# Patient Record
Sex: Male | Born: 1995 | Race: White | Hispanic: No | Marital: Single
Health system: Southern US, Community
[De-identification: ages and names within clinical notes are randomized; demographics above are authoritative.]

## PROBLEM LIST (undated history)

## (undated) DIAGNOSIS — K4090 Unilateral inguinal hernia, without obstruction or gangrene, not specified as recurrent: Secondary | ICD-10-CM

---
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
## 2017-03-24 IMAGING — CT CT NECK W/O CM
2 of 3 series · 8 of 14 positions shown, 9 images · non-contrast
Comparison: None.

CLINICAL DATA: Difficulty swallowing, possible swallowed foreign
body eating chicken

EXAM:
CT NECK WITHOUT CONTRAST
TECHNIQUE: Multidetector CT imaging of the neck was performed following the
standard protocol without intravenous contrast.

[Series 2: axial neck · axial · 0.52mm/px · z∈[-253,-83]mm · 4 of 143 slices shown]
[im 29/143  bone]
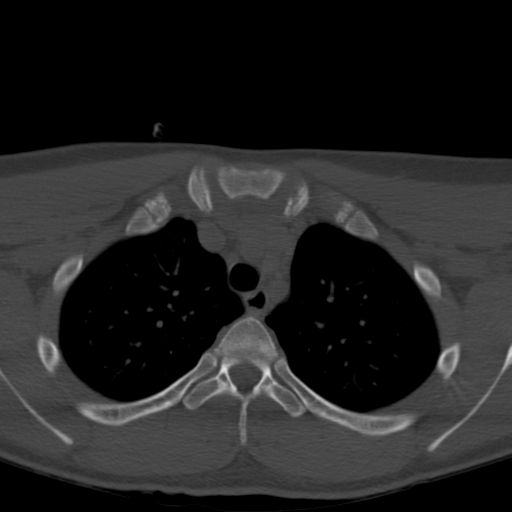
[im 57/143  bone]
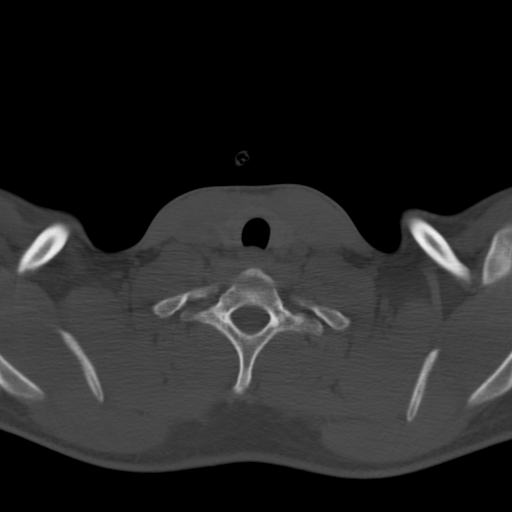
[im 86/143  bone]
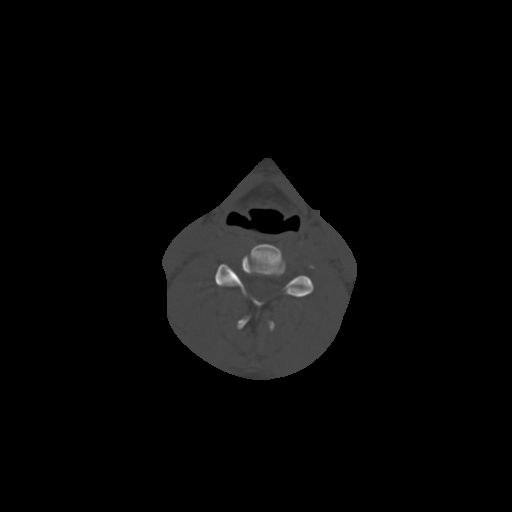
[im 114/143  bone]
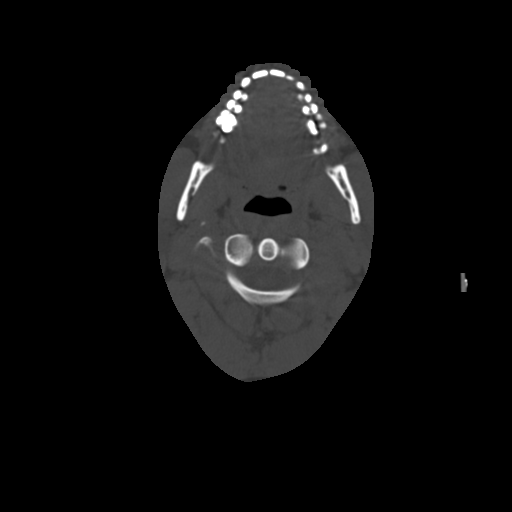

[Series 7: ax oropharynx · axial · 0.46mm/px · z∈[-276,-105]mm · 4 of 149 slices shown, 5 images]
[im 30/149  soft-tissue]
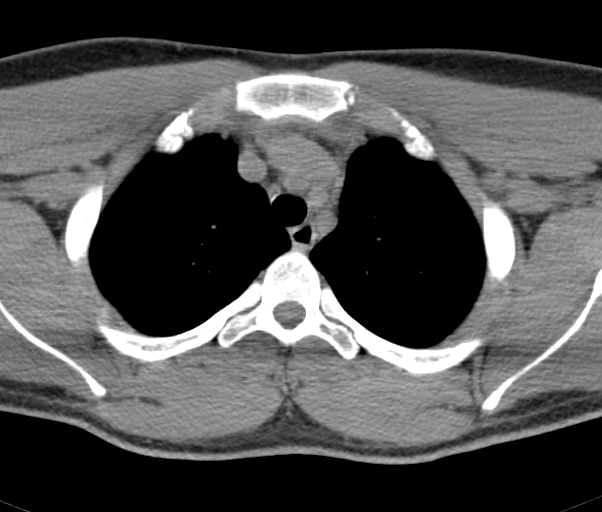
[im 30/149  bone]
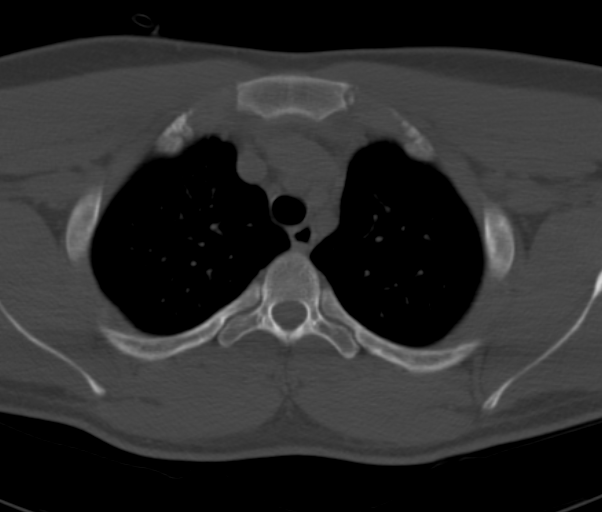
[im 60/149  bone]
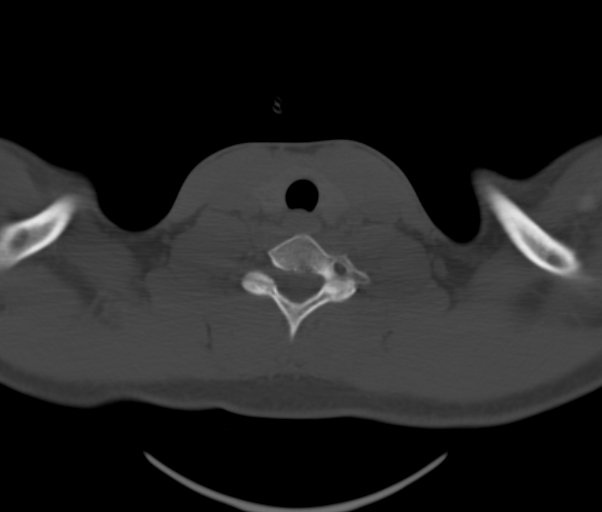
[im 89/149  bone]
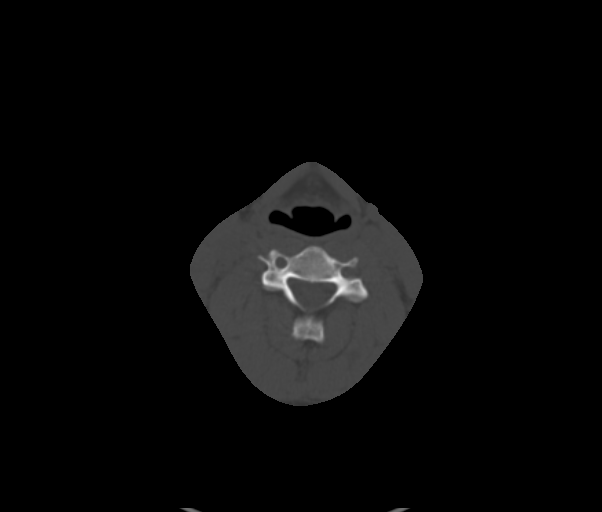
[im 119/149  bone]
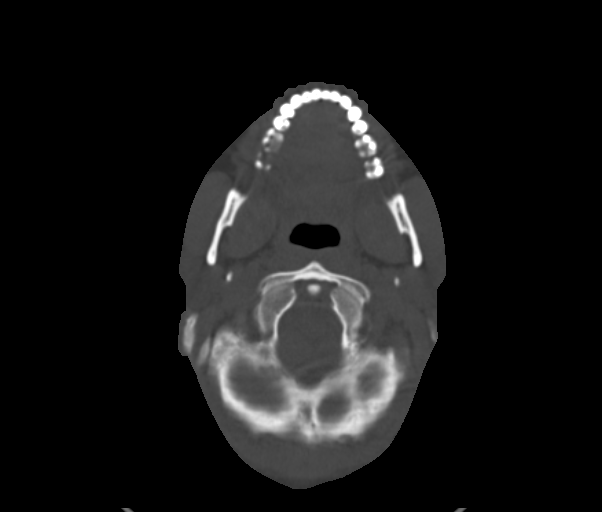

[8 of 14 positions shown; findings below may reference images not displayed]

FINDINGS: Airway is patent and midline. Pharynx and larynx are unremarkable.
No radiopaque foreign body. No soft tissue abnormality. Lung apices
are unremarkable. Thyroid gland appears normal. No lymphadenopathy.
No acute osseous abnormality.
IMPRESSION: No radiopaque foreign body or acute abnormality identified.

## 2021-05-28 ENCOUNTER — Ambulatory Visit: Admit: 2021-05-28 | Discharge: 2021-05-28 | Payer: TRICARE (CHAMPUS) | Attending: Surgery

## 2021-05-28 DIAGNOSIS — K4090 Unilateral inguinal hernia, without obstruction or gangrene, not specified as recurrent: Secondary | ICD-10-CM

## 2021-05-28 NOTE — Progress Notes (Signed)
HPI: Jacob Cobb is a 26 y.o. male who presents with a right sided inguinal hernia. He states that he started feeling some pain and discomfort in the area about a month and a half ago that increased with walking and lifting heavy weights. He did not notice any bulges or redness in the area. Recently he was in [facility] for dehydration where a physician there said they could feel a bulge in the area. [facility] this showed a small right sided inguinal hernia. He has a surgical history of umbilical hernia repair done back in 2019, and has no significant medical history. He is a previous smoker who quit about one year ago.    No past medical history on file.    No past surgical history on file.    No current outpatient medications on file.     Not on File    Social History     Socioeconomic History    Marital status: Divorced       No family history on file.    Review of Systems   Genitourinary:         Groin pain     All other systems reviewed and are negative.            Objective:  Vitals:    05/28/21 1018   Weight: 220 lb (99.8 kg)          Physical Exam  Constitutional:       General: He is not in acute distress.     Appearance: Normal appearance. He is not ill-appearing, toxic-appearing or diaphoretic.   HENT:      Head: Normocephalic and atraumatic.   Eyes:      Extraocular Movements: Extraocular movements intact.      Conjunctiva/sclera: Conjunctivae normal.      Pupils: Pupils are equal, round, and reactive to light.   Cardiovascular:      Rate and Rhythm: Normal rate and regular rhythm.      Heart sounds: No murmur heard.    No friction rub. No gallop.   Pulmonary:      Effort: Pulmonary effort is normal. No respiratory distress.      Breath sounds: Normal breath sounds. No stridor. No wheezing, rhonchi or rales.   Abdominal:      General: Abdomen is flat. Bowel sounds are normal. There is no distension.      Palpations: Abdomen is soft.      Tenderness: There is no  abdominal tenderness. There is no guarding.   Genitourinary:     Comments: Small right groin hernia, more noticeable on valsalva  Some movement with valsalva on left, possible very small hernia  Neurological:      Mental Status: He is alert.   Psychiatric:         Mood and Affect: Mood normal.         Assessment/Plan:     1. Inguinal hernia of right side without obstruction or gangrene  Assessment & Plan:   Given the symptomatic nature of his right inguinal hernia we will proceed with robotic assisted right inguinal hernia repair.  There was some movement on exam in the left groin with Valsalva so there is a possibly bilateral inguinal hernia but we will be sure until we get into the abdominal cavity.  I discussed repairing both sides if we were to find a left-sided inguinal hernia as well.  He is amenable to this plan.         Return in about 3 weeks (around 06/18/2021) for After surgery.      Chief Complaint   Patient presents with    New Patient     INGUINAL HERNIA, MILD DISCOMFORT       PCP: Ranelle Oyster MD

## 2021-05-28 NOTE — Assessment & Plan Note (Signed)
Given the symptomatic nature of his right inguinal hernia we will proceed with robotic assisted right inguinal hernia repair.  There was some movement on exam in the left groin with Valsalva so there is a possibly bilateral inguinal hernia but we will be sure until we get into the abdominal cavity.  I discussed repairing both sides if we were to find a left-sided inguinal hernia as well.  He is amenable to this plan.

## 2021-05-28 NOTE — H&P (View-Only) (Signed)
HPI: Jacob Cobb is a 26 y.o. male who presents with a right sided inguinal hernia. He states that he started feeling some pain and discomfort in the area about a month and a half ago that increased with walking and lifting heavy weights. He did not notice any bulges or redness in the area. Recently he was in [facility] for dehydration where a physician there said they could feel a bulge in the area. US done at the [facility] clinic done after this showed a small right sided inguinal hernia. He has a surgical history of umbilical hernia repair done back in 2019, and has no significant medical history. He is a previous smoker who quit about one year ago.    No past medical history on file.    No past surgical history on file.    No current outpatient medications on file.     Not on File    Social History     Socioeconomic History    Marital status: Divorced       No family history on file.    Review of Systems   Genitourinary:         Groin pain     All other systems reviewed and are negative.            Objective:  Vitals:    05/28/21 1018   Weight: 220 lb (99.8 kg)          Physical Exam  Constitutional:       General: He is not in acute distress.     Appearance: Normal appearance. He is not ill-appearing, toxic-appearing or diaphoretic.   HENT:      Head: Normocephalic and atraumatic.   Eyes:      Extraocular Movements: Extraocular movements intact.      Conjunctiva/sclera: Conjunctivae normal.      Pupils: Pupils are equal, round, and reactive to light.   Cardiovascular:      Rate and Rhythm: Normal rate and regular rhythm.      Heart sounds: No murmur heard.    No friction rub. No gallop.   Pulmonary:      Effort: Pulmonary effort is normal. No respiratory distress.      Breath sounds: Normal breath sounds. No stridor. No wheezing, rhonchi or rales.   Abdominal:      General: Abdomen is flat. Bowel sounds are normal. There is no distension.      Palpations: Abdomen is soft.      Tenderness: There is no  abdominal tenderness. There is no guarding.   Genitourinary:     Comments: Small right groin hernia, more noticeable on valsalva  Some movement with valsalva on left, possible very small hernia  Neurological:      Mental Status: He is alert.   Psychiatric:         Mood and Affect: Mood normal.         Assessment/Plan:     1. Inguinal hernia of right side without obstruction or gangrene  Assessment & Plan:   Given the symptomatic nature of his right inguinal hernia we will proceed with robotic assisted right inguinal hernia repair.  There was some movement on exam in the left groin with Valsalva so there is a possibly bilateral inguinal hernia but we will be sure until we get into the abdominal cavity.  I discussed repairing both sides if we were to find a left-sided inguinal hernia as well.  He is amenable to this plan.  Return in about 3 weeks (around 06/18/2021) for After surgery.      Chief Complaint   Patient presents with    New Patient     INGUINAL HERNIA, MILD DISCOMFORT       PCP: Ranelle Oyster MD

## 2021-06-02 NOTE — Progress Notes (Signed)
Pre Procedure Patient Instructions    Procedure Location hospital:[facility]: [address] [place] - Drop off at Outpatient Services to the left of [facility] entrance and check in at the Surgery Reception desk to your immediate left.    Procedure Date 06/10/21  Arrival Time  1145      Medications:  Medication to be taken the morning of surgery with a few sips of water only: NONE    Stop all supplements, vitamins and herbal remedies one week prior to your procedure, unless your doctor told you to continue taking.  Do not take over the counter pain medications except plain Tylenol or acetaminophen unless your doctor told you to do so.  If you are taking blood thinners, call the doctor performing your procedure and prescribing physician for instructions on when to stop.    Procedure Preparation    Diet Restrictions:No food or drink including gum or mints -after midnight    Skin Preparation:   Wash with Hibiclens or an antibacterial soap (e.g. Dial soap) the night before and morning of procedure.  Using a clean washcloth, wash from neck to toes for 3 minutes.Gently clean the area where your surgery will be done thoroughly  Do not use Hibiclens on or near your face, eyes, ears, or head  Do not put on any deodorants, lotions, powders, or oils afterwards. Be sure to put on clean clothing.    Other Preparation:  Call your surgeon right away if you get any wounds, cuts, scrapes, scabs, rashes, bug bites at or near your operative site.        Day of Procedure Patient Instruction:  Do not smoke, vape, chew tobacco, drink alcohol or use recreational drugs on the day of your procedure  Remove all jewelry, piercings and metal accessories  Do not wear contacts, tampons, make-up, lotions, creams, powders, fragrances or deodorant  Do not bring valuables or money  Bring a picture ID and insurance card and any of the following that are applicable to you:     Storage case for eyeglasses,  hearing aids, dentures, etc     A loose button-up shirt if instructed     Copy of your Living Will and/or Medical Durable Power of Attorney     List of current medications including name and dosage    .  If you are going home the same day as your procedure, a support person should accompany you to the facility and must transport you home.  If you plan to take public transportation of any sort, your support person must accompany you home.  You will need someone to stay with you for 24 hours after your procedure with sedation of any kind.             Comments:     The information and visitor policy was reviewed with you during your Pre-Admission Testing interview and you verbalized understanding. If you have any additional questions please contact [phone]    For financial questions regarding your procedure at a [facility] facility, please contact [phone], option 1    For financial questions regarding anesthesia at a [facility] facility, please  contact [phone]

## 2021-06-10 ENCOUNTER — Inpatient Hospital Stay: Payer: TRICARE (CHAMPUS) | Attending: Surgery

## 2021-06-10 MED ORDER — NEOSTIGMINE METHYLSULFATE 5 MG/5ML IV SOSY
5 MG/ML | INTRAVENOUS | Status: AC
Start: 2021-06-10 — End: ?

## 2021-06-10 MED ORDER — NORMAL SALINE FLUSH 0.9 % IV SOLN
0.9 % | INTRAVENOUS | Status: DC | PRN
Start: 2021-06-10 — End: 2021-06-10

## 2021-06-10 MED ORDER — OXYCODONE HCL 5 MG PO TABS
5 MG | ORAL_TABLET | Freq: Two times a day (BID) | ORAL | 0 refills | Status: AC | PRN
Start: 2021-06-10 — End: 2021-06-13

## 2021-06-10 MED ORDER — HYDROMORPHONE HCL 2 MG/ML IJ SOLN
2 MG/ML | INTRAMUSCULAR | Status: AC
Start: 2021-06-10 — End: ?

## 2021-06-10 MED ORDER — HYDROMORPHONE HCL 2 MG/ML IJ SOLN
2 MG/ML | INTRAMUSCULAR | Status: DC | PRN
Start: 2021-06-10 — End: 2021-06-10
  Administered 2021-06-10: 21:00:00 .6 via INTRAVENOUS
  Administered 2021-06-10: 21:00:00 .4 via INTRAVENOUS

## 2021-06-10 MED ORDER — KETOROLAC TROMETHAMINE 30 MG/ML IJ SOLN
30 MG/ML | INTRAMUSCULAR | Status: AC
Start: 2021-06-10 — End: ?

## 2021-06-10 MED ORDER — CEFAZOLIN IN SODIUM CHLORIDE 2-0.9 GM/100ML-% IV SOLN
INTRAVENOUS | Status: AC
Start: 2021-06-10 — End: 2021-06-10
  Administered 2021-06-10: 21:00:00 2000 mg via INTRAVENOUS

## 2021-06-10 MED ORDER — FENTANYL CITRATE (PF) 100 MCG/2ML IJ SOLN
100 MCG/2ML | INTRAMUSCULAR | Status: AC
Start: 2021-06-10 — End: ?

## 2021-06-10 MED ORDER — NEOSTIGMINE METHYLSULFATE 5 MG/5ML IV SOSY
5 MG/ML | INTRAVENOUS | Status: DC | PRN
Start: 2021-06-10 — End: 2021-06-10
  Administered 2021-06-10: 21:00:00 5 via INTRAVENOUS

## 2021-06-10 MED ORDER — LACTATED RINGERS IV SOLN
INTRAVENOUS | Status: DC | PRN
Start: 2021-06-10 — End: 2021-06-10
  Administered 2021-06-10: 20:00:00 via INTRAVENOUS

## 2021-06-10 MED ORDER — GLYCOPYRROLATE 0.4 MG/2ML IJ SOLN
0.4 MG/2ML | INTRAMUSCULAR | Status: AC
Start: 2021-06-10 — End: ?

## 2021-06-10 MED ORDER — ONDANSETRON HCL 4 MG/2ML IJ SOLN
4 MG/2ML | Freq: Four times a day (QID) | INTRAMUSCULAR | Status: DC | PRN
Start: 2021-06-10 — End: 2021-06-10

## 2021-06-10 MED ORDER — SUCCINYLCHOLINE CHLORIDE 20 MG/ML IJ SOLN
20 MG/ML | INTRAMUSCULAR | Status: DC | PRN
Start: 2021-06-10 — End: 2021-06-10
  Administered 2021-06-10: 21:00:00 160 via INTRAVENOUS

## 2021-06-10 MED ORDER — ENOXAPARIN SODIUM 40 MG/0.4ML IJ SOSY
40 MG/0.4ML | Freq: Every day | INTRAMUSCULAR | Status: DC
Start: 2021-06-10 — End: 2021-06-10

## 2021-06-10 MED ORDER — GLYCOPYRROLATE 0.4 MG/2ML IJ SOLN
0.4 MG/2ML | INTRAMUSCULAR | Status: DC | PRN
Start: 2021-06-10 — End: 2021-06-10
  Administered 2021-06-10: 21:00:00 .6 via INTRAVENOUS

## 2021-06-10 MED ORDER — MIDAZOLAM HCL 2 MG/2ML IJ SOLN
2 MG/ML | INTRAMUSCULAR | Status: DC | PRN
Start: 2021-06-10 — End: 2021-06-10
  Administered 2021-06-10: 20:00:00 2 via INTRAVENOUS

## 2021-06-10 MED ORDER — ONDANSETRON 4 MG PO TBDP
4 MG | Freq: Three times a day (TID) | ORAL | Status: DC | PRN
Start: 2021-06-10 — End: 2021-06-10

## 2021-06-10 MED ORDER — SODIUM CHLORIDE 0.9 % IV SOLN
0.9 % | INTRAVENOUS | Status: DC | PRN
Start: 2021-06-10 — End: 2021-06-10

## 2021-06-10 MED ORDER — LACTATED RINGERS IV SOLN
INTRAVENOUS | Status: DC
Start: 2021-06-10 — End: 2021-06-10
  Administered 2021-06-10: 17:00:00 via INTRAVENOUS

## 2021-06-10 MED ORDER — KETOROLAC TROMETHAMINE 30 MG/ML IJ SOLN
30 MG/ML | INTRAMUSCULAR | Status: DC | PRN
Start: 2021-06-10 — End: 2021-06-10
  Administered 2021-06-10: 21:00:00 30 via INTRAVENOUS

## 2021-06-10 MED ORDER — HALOPERIDOL LACTATE 5 MG/ML IJ SOLN
5 MG/ML | Freq: Once | INTRAMUSCULAR | Status: DC | PRN
Start: 2021-06-10 — End: 2021-06-10

## 2021-06-10 MED ORDER — IBUPROFEN 800 MG PO TABS
800 MG | Freq: Three times a day (TID) | ORAL | Status: DC
Start: 2021-06-10 — End: 2021-06-10

## 2021-06-10 MED ORDER — OXYCODONE HCL 5 MG PO TABS
5 MG | ORAL | Status: DC | PRN
Start: 2021-06-10 — End: 2021-06-10

## 2021-06-10 MED ORDER — NORMAL SALINE FLUSH 0.9 % IV SOLN
0.9 % | Freq: Two times a day (BID) | INTRAVENOUS | Status: DC
Start: 2021-06-10 — End: 2021-06-10

## 2021-06-10 MED ORDER — MIDAZOLAM HCL 2 MG/2ML IJ SOLN
2 MG/ML | INTRAMUSCULAR | Status: AC
Start: 2021-06-10 — End: ?

## 2021-06-10 MED ORDER — GABAPENTIN 100 MG PO CAPS
100 MG | ORAL_CAPSULE | Freq: Three times a day (TID) | ORAL | 0 refills | Status: DC
Start: 2021-06-10 — End: 2021-06-30

## 2021-06-10 MED ORDER — LACTATED RINGERS IV SOLN
INTRAVENOUS | Status: DC
Start: 2021-06-10 — End: 2021-06-10

## 2021-06-10 MED ORDER — HYDROMORPHONE HCL 1 MG/ML IJ SOLN
1 MG/ML | INTRAMUSCULAR | Status: AC
Start: 2021-06-10 — End: 2021-06-10
  Administered 2021-06-10: 22:00:00 0.5 via INTRAVENOUS

## 2021-06-10 MED ORDER — ONDANSETRON HCL 4 MG/2ML IJ SOLN
4 MG/2ML | Freq: Once | INTRAMUSCULAR | Status: DC | PRN
Start: 2021-06-10 — End: 2021-06-10

## 2021-06-10 MED ORDER — LIDOCAINE HCL (PF) 2 % IJ SOLN
2 % | INTRAMUSCULAR | Status: DC | PRN
Start: 2021-06-10 — End: 2021-06-10
  Administered 2021-06-10: 21:00:00 100 via INTRAVENOUS

## 2021-06-10 MED ORDER — BUPIVACAINE-EPINEPHRINE 0.5% -1:200000 IJ SOLN
INTRAMUSCULAR | Status: DC | PRN
Start: 2021-06-10 — End: 2021-06-10
  Administered 2021-06-10: 21:00:00 60 via INTRADERMAL

## 2021-06-10 MED ORDER — HYDROMORPHONE HCL 1 MG/ML IJ SOLN
1 MG/ML | INTRAMUSCULAR | Status: AC | PRN
Start: 2021-06-10 — End: 2021-06-10

## 2021-06-10 MED ORDER — HYDROMORPHONE HCL 1 MG/ML IJ SOLN
1 MG/ML | INTRAMUSCULAR | Status: DC | PRN
Start: 2021-06-10 — End: 2021-06-10

## 2021-06-10 MED ORDER — DEXAMETHASONE SODIUM PHOSPHATE 4 MG/ML IJ SOLN
4 MG/ML | INTRAMUSCULAR | Status: AC
Start: 2021-06-10 — End: ?

## 2021-06-10 MED ORDER — PROPOFOL 200 MG/20ML IV EMUL
200 MG/20ML | INTRAVENOUS | Status: AC
Start: 2021-06-10 — End: ?

## 2021-06-10 MED ORDER — SODIUM CHLORIDE (PF) 0.9 % IJ SOLN
0.9 % | INTRAMUSCULAR | Status: AC
Start: 2021-06-10 — End: ?

## 2021-06-10 MED ORDER — LIDOCAINE HCL (PF) 2 % IJ SOLN
2 % | INTRAMUSCULAR | Status: AC
Start: 2021-06-10 — End: ?

## 2021-06-10 MED ORDER — PROPOFOL 200 MG/20ML IV EMUL
200 MG/20ML | INTRAVENOUS | Status: DC | PRN
Start: 2021-06-10 — End: 2021-06-10
  Administered 2021-06-10: 21:00:00 200 via INTRAVENOUS

## 2021-06-10 MED ORDER — LIDOCAINE HCL (PF) 1 % IJ SOLN
1 % | Freq: Once | INTRAMUSCULAR | Status: AC | PRN
Start: 2021-06-10 — End: 2021-06-10
  Administered 2021-06-10: 17:00:00 0.3 mL via INTRADERMAL

## 2021-06-10 MED ORDER — SUCCINYLCHOLINE CHLORIDE 20 MG/ML IJ SOLN
20 MG/ML | INTRAMUSCULAR | Status: AC
Start: 2021-06-10 — End: ?

## 2021-06-10 MED ORDER — ROCURONIUM BROMIDE 50 MG/5ML IV SOLN
50 MG/5ML | INTRAVENOUS | Status: AC
Start: 2021-06-10 — End: ?

## 2021-06-10 MED ORDER — ONDANSETRON HCL 4 MG/2ML IJ SOLN
4 MG/2ML | INTRAMUSCULAR | Status: DC | PRN
Start: 2021-06-10 — End: 2021-06-10
  Administered 2021-06-10: 21:00:00 4 via INTRAVENOUS

## 2021-06-10 MED ORDER — IBUPROFEN 800 MG PO TABS
800 MG | ORAL_TABLET | Freq: Three times a day (TID) | ORAL | 0 refills | Status: DC
Start: 2021-06-10 — End: 2021-06-30

## 2021-06-10 MED ORDER — OXYCODONE HCL 5 MG PO TABS
5 MG | ORAL | Status: DC | PRN
Start: 2021-06-10 — End: 2021-06-10
  Administered 2021-06-10: 22:00:00 5 mg via ORAL

## 2021-06-10 MED ORDER — ACETAMINOPHEN 500 MG PO TABS
500 MG | ORAL_TABLET | Freq: Three times a day (TID) | ORAL | 0 refills | Status: AC
Start: 2021-06-10 — End: 2021-06-20

## 2021-06-10 MED ORDER — ROCURONIUM BROMIDE 50 MG/5ML IV SOLN
50 MG/5ML | INTRAVENOUS | Status: DC | PRN
Start: 2021-06-10 — End: 2021-06-10
  Administered 2021-06-10: 21:00:00 10 via INTRAVENOUS
  Administered 2021-06-10: 21:00:00 30 via INTRAVENOUS

## 2021-06-10 MED ORDER — ACETAMINOPHEN 500 MG PO TABS
500 MG | Freq: Three times a day (TID) | ORAL | Status: DC
Start: 2021-06-10 — End: 2021-06-10

## 2021-06-10 MED ORDER — DEXAMETHASONE SODIUM PHOSPHATE 4 MG/ML IJ SOLN
4 MG/ML | INTRAMUSCULAR | Status: DC | PRN
Start: 2021-06-10 — End: 2021-06-10
  Administered 2021-06-10: 21:00:00 8 via INTRAVENOUS

## 2021-06-10 MED ORDER — FENTANYL CITRATE (PF) 100 MCG/2ML IJ SOLN
100 MCG/2ML | INTRAMUSCULAR | Status: DC | PRN
Start: 2021-06-10 — End: 2021-06-10
  Administered 2021-06-10: 21:00:00 100 via INTRAVENOUS

## 2021-06-10 MED ORDER — FENTANYL CITRATE (PF) 100 MCG/2ML IJ SOLN
100 MCG/2ML | INTRAMUSCULAR | Status: DC | PRN
Start: 2021-06-10 — End: 2021-06-10

## 2021-06-10 MED ORDER — ONDANSETRON HCL 4 MG/2ML IJ SOLN
4 MG/2ML | INTRAMUSCULAR | Status: AC
Start: 2021-06-10 — End: ?

## 2021-06-10 MED ORDER — GABAPENTIN 100 MG PO CAPS
100 MG | Freq: Three times a day (TID) | ORAL | Status: DC
Start: 2021-06-10 — End: 2021-06-10

## 2021-06-10 MED FILL — ONDANSETRON HCL 4 MG/2ML IJ SOLN: 4 MG/2ML | INTRAMUSCULAR | Qty: 2

## 2021-06-10 MED FILL — KETOROLAC TROMETHAMINE 30 MG/ML IJ SOLN: 30 MG/ML | INTRAMUSCULAR | Qty: 1

## 2021-06-10 MED FILL — DILAUDID 1 MG/ML IJ SOLN: 1 MG/ML | INTRAMUSCULAR | Qty: 0.5

## 2021-06-10 MED FILL — SODIUM CHLORIDE (PF) 0.9 % IJ SOLN: 0.9 % | INTRAMUSCULAR | Qty: 10

## 2021-06-10 MED FILL — LIDOCAINE HCL (PF) 2 % IJ SOLN: 2 % | INTRAMUSCULAR | Qty: 5

## 2021-06-10 MED FILL — SUCCINYLCHOLINE CHLORIDE 20 MG/ML IJ SOLN: 20 MG/ML | INTRAMUSCULAR | Qty: 10

## 2021-06-10 MED FILL — OXYCODONE HCL 5 MG PO TABS: 5 MG | ORAL | Qty: 1

## 2021-06-10 MED FILL — GLYCOPYRROLATE 0.4 MG/2ML IJ SOLN: 0.4 MG/2ML | INTRAMUSCULAR | Qty: 6

## 2021-06-10 MED FILL — MIDAZOLAM HCL 2 MG/2ML IJ SOLN: 2 MG/ML | INTRAMUSCULAR | Qty: 2

## 2021-06-10 MED FILL — ROCURONIUM BROMIDE 50 MG/5ML IV SOLN: 50 MG/5ML | INTRAVENOUS | Qty: 5

## 2021-06-10 MED FILL — HYDROMORPHONE HCL 2 MG/ML IJ SOLN: 2 MG/ML | INTRAMUSCULAR | Qty: 1

## 2021-06-10 MED FILL — CEFAZOLIN IN SODIUM CHLORIDE 2-0.9 GM/100ML-% IV SOLN: INTRAVENOUS | Qty: 100

## 2021-06-10 MED FILL — NEOSTIGMINE METHYLSULFATE 5 MG/5ML IV SOSY: 5 MG/ML | INTRAVENOUS | Qty: 5

## 2021-06-10 MED FILL — FENTANYL CITRATE (PF) 100 MCG/2ML IJ SOLN: 100 MCG/2ML | INTRAMUSCULAR | Qty: 2

## 2021-06-10 MED FILL — DIPRIVAN 200 MG/20ML IV EMUL: 200 MG/20ML | INTRAVENOUS | Qty: 20

## 2021-06-10 MED FILL — DEXAMETHASONE SODIUM PHOSPHATE 4 MG/ML IJ SOLN: 4 MG/ML | INTRAMUSCULAR | Qty: 2

## 2021-06-10 MED FILL — XYLOCAINE-MPF 1 % IJ SOLN: 1 % | INTRAMUSCULAR | Qty: 2

## 2021-06-10 NOTE — Op Note (Signed)
Operative Note      Patient: Jacob Cobb  Date of Birth: 09-29-1995  MRN: [id number]    Date of Procedure: 04-Jul-2021    Pre-Op Diagnosis Codes:     * Right inguinal hernia [K40.90]    Post-Op Diagnosis: Same       Procedure(s):  HERNIA INGUINAL REPAIR ROBOTIC XI    Surgeon(s):  Noel Christmas, MD    Assistant:   * No surgical staff found *    Anesthesia: General    Estimated Blood Loss (mL): Minimal    Complications: None    Specimens:   * No specimens in log *    Implants:  Implant Name Type Inv. Item Serial No. Manufacturer Lot No. LRB No. Used Action   MESH HERN L W10.8XL16CM R INGUINAL WHT POLYPR MFIL - [id number]  MESH HERN L W10.8XL16CM R INGUINAL WHT POLYPR MFIL  BARD DAVOL-WD [id number] Right 1 Implanted         Drains: * No LDAs found *    Findings: Small right indirect inguinal hernia      Detailed Description of Procedure:   The patient was identified in preoperative holding and brought back to the operating room. The patient was placed supine on the operating room table and padded appropriately. Monitors were applied and there was smooth induction of general endotracheal anesthesia. Their abdomen was prepped and draped in the standard sterile surgical fashion. A timeout was performed and all team members present agreed that this was the correct procedure on the correct patient. We also confirmed administration of appropriate preoperative antibiotics.    Began the procedure by incising sharply in the left upper quadrant then using Optiview technique gained entry to the peritoneal cavity without difficulty or complications.  The abdomen was insufflated to 15 mmHg.  Additional robotic trochars were placed under direct visualization in appropriate locations and then the Optiview trocar was exchanged for a robotic trocar.  The robot was docked.  Began the procedure by creating a preperitoneal flap in the right lower quadrant and then dissected down medially to the pubis and out laterally toward the right to  give myself enough space for my mesh.  The hernia sac was then dissected off the inguinal canal contents on the right side without any injury to the canal contents.  The mesh was then placed with good coverage of the defect and secured medially and laterally with a 3-0 Vicryl suture.  The preperitoneal flap was then closed with a running #3-0 barbed suture.  An ilioinguinal block was then performed on the right side using anatomic landmarks and under direct visualization.  Local anesthetic was then instilled each trocar site under direct visualization.  Insufflation was released and trochars were removed.  Skin was closed with interrupted 3-0 Monocryl suture.    Sterile dressings were applied. The patient was extubated in the operating room and transported to the recovery room in stable condition. All sponge, instrument, and needle counts were reported as correct at the end of the procedure.      Electronically signed by Noel Christmas, MD on 07/04/21 at 5:37 PM

## 2021-06-10 NOTE — Anesthesia Pre-Procedure Evaluation (Addendum)
Department of Anesthesiology  Preprocedure Note       Name:  Jacob Cobb   Age:  26 y.o.  DOB:  1995-03-04                                          MRN:  [id number]         Date:  06/10/2021      Surgeon: Moishe Spice):  Noel Christmas, MD    Procedure: Procedure(s):  HERNIA INGUINAL REPAIR ROBOTIC XI    Medications prior to admission:   Prior to Admission medications    Not on File       Current medications:    No current facility-administered medications for this encounter.     No current outpatient medications on file.       Allergies:  No Known Allergies    Problem List:    Patient Active Problem List   Diagnosis Code   . Inguinal hernia of right side without obstruction or gangrene K40.90       Past Medical History:        Diagnosis Date   . History of hypothyroidism    . Inguinal hernia    . Wears contact lenses        Past Surgical History:        Procedure Laterality Date   . HERNIA REPAIR  11/2017    UMBILICAL       Social History:    Social History     Tobacco Use   . Smoking status: Former     Types: Cigarettes   . Smokeless tobacco: Never   Substance Use Topics   . Alcohol use: Never                                Counseling given: Not Answered      Vital Signs (Current):   Vitals:    06/02/21 1054   Weight: 230 lb (104.3 kg)   Height: 5\' 10"  (1.778 m)                                              BP Readings from Last 3 Encounters:   No data found for BP       NPO Status:                                                                                 BMI:   Wt Readings from Last 3 Encounters:   05/28/21 220 lb (99.8 kg)     Body mass index is 33 kg/m.    CBC: No results found for: WBC, RBC, HGB, HCT, MCV, RDW, PLT    CMP: No results found for: NA, K, CL, CO2, BUN, CREATININE, GFRAA, AGRATIO, LABGLOM, GLUCOSE, GLU, PROT, CALCIUM, BILITOT, ALKPHOS, AST, ALT    POC Tests: No results for input(s): POCGLU, POCNA, POCK, POCCL, POCBUN, POCHEMO, POCHCT  in the last 72 hours.    Coags: No results found for: PROTIME,  INR, APTT    HCG (If Applicable): No results found for: PREGTESTUR, PREGSERUM, HCG, HCGQUANT     ABGs: No results found for: PHART, PO2ART, PCO2ART, HCO3ART, BEART, O2SATART     Type & Screen (If Applicable):  No results found for: LABABO, LABRH    Drug/Infectious Status (If Applicable):  No results found for: HIV, HEPCAB    COVID-19 Screening (If Applicable): No results found for: COVID19        Anesthesia Evaluation  Patient summary reviewed and Nursing notes reviewed no history of anesthetic complications:   Airway: Mallampati: II  TM distance: >3 FB   Neck ROM: full  Mouth opening: > = 3 FB   Dental:          Pulmonary:Negative Pulmonary ROS and normal exam                               Cardiovascular:Negative CV ROS  Exercise tolerance: good (>4 METS),            Beta Blocker:  Not on Beta Blocker         Neuro/Psych:   (+) headaches: migraine headaches,             GI/Hepatic/Renal:            ROS comment: Inguinal hernia.   Endo/Other:    (+) hypothyroidism::., .                 Abdominal:             Vascular: negative vascular ROS.         Other Findings:           Anesthesia Plan      general     ASA 2       Induction: intravenous.    MIPS: Postoperative opioids intended, Prophylactic antiemetics administered and Postoperative trial extubation.  Anesthetic plan and risks discussed with patient.      Plan discussed with CRNA.                    Archie Balboa, MD   06/10/2021

## 2021-06-10 NOTE — Anesthesia Post-Procedure Evaluation (Signed)
Department of Anesthesiology  Postprocedure Note    Patient: Jacob Cobb  MRN: [id number]  Birthdate: August 11, 1995  Date of evaluation: 06/10/2021      Procedure Summary     Date: 06/10/21 Room / Location: [facility] OR 05 / [facility]    Anesthesia Start: 1628 Anesthesia Stop: 1733    Procedure: HERNIA INGUINAL REPAIR ROBOTIC XI (Right: Abdomen) Diagnosis:       Right inguinal hernia      (Right inguinal hernia [K40.90])    Surgeons: Noel Christmas, MD Responsible Provider: De Burrs, MD    Anesthesia Type: General ASA Status: 2          Anesthesia Type: General    Aldrete Phase I: Aldrete Score: 9    Aldrete Phase II:        Anesthesia Post Evaluation    Patient location during evaluation: PACU  Patient participation: complete - patient participated  Level of consciousness: awake  Pain score: 2  Airway patency: patent  Nausea & Vomiting: no vomiting and no nausea  Complications: no  Cardiovascular status: hemodynamically stable  Respiratory status: acceptable  Hydration status: stable  Comments: vss   See pacu notes

## 2021-06-10 NOTE — Interval H&P Note (Signed)
Update History & Physical    The patient's History and Physical   was reviewed with the patient and I examined the patient. There was no change. The surgical site was confirmed by the patient and me.     Plan: The risks, benefits, expected outcome, and alternative to the recommended procedure have been discussed with the patient. Patient understands and wants to proceed with the procedure.     Electronically signed by Noel Christmas, MD on 06/10/2021 at 2:47 PM

## 2021-06-30 ENCOUNTER — Ambulatory Visit: Admit: 2021-06-30 | Discharge: 2021-06-30 | Payer: TRICARE (CHAMPUS) | Attending: Surgery

## 2021-06-30 ENCOUNTER — Encounter: Payer: TRICARE (CHAMPUS) | Attending: Surgery

## 2021-06-30 DIAGNOSIS — Z09 Encounter for follow-up examination after completed treatment for conditions other than malignant neoplasm: Secondary | ICD-10-CM

## 2021-06-30 MED ORDER — GABAPENTIN 300 MG PO CAPS
300 MG | ORAL_CAPSULE | Freq: Three times a day (TID) | ORAL | 0 refills | Status: AC
Start: 2021-06-30 — End: 2021-07-30

## 2021-06-30 NOTE — Progress Notes (Signed)
HPI: Jacob Cobb is a 26 y.o. male who is status post robotic assisted right inguinal hernia repair.  He is having some discomfort over the right anterior thigh.  This was initially was mostly a numbness but has evolved into a pain with some tingling.  Otherwise doing well postoperatively.    Exam:     Incisions are clean dry and intact except for a little bit of a small superficial dehiscence over the left upper quadrant incision.  There is some tenderness to palpation over the right anterior thigh.    A/P:    1. Postoperative examination       We will prescribe some gabapentin 300 mg 3 times daily and follow-up with the patient in 3 weeks

## 2021-07-02 ENCOUNTER — Encounter: Payer: TRICARE (CHAMPUS) | Attending: Surgery

## 2021-07-02 ENCOUNTER — Encounter: Attending: Surgery

## 2021-07-23 ENCOUNTER — Ambulatory Visit: Admit: 2021-07-23 | Discharge: 2021-07-23 | Payer: TRICARE (CHAMPUS) | Attending: Surgery

## 2021-07-23 DIAGNOSIS — Z09 Encounter for follow-up examination after completed treatment for conditions other than malignant neoplasm: Secondary | ICD-10-CM

## 2021-07-23 NOTE — Progress Notes (Signed)
HPI: Jacob Cobb is a 26 y.o. male who is status post robotic assisted right inguinal hernia repair.  Postoperatively he had some right upper thigh and right groin pain.  The right thigh pain has subsided but the groin pain continues.  He reports that at a level 4 out of 10 most of the time but it shoots up with activity.  No other new signs or symptoms at this time.    Exam:     Incisions are clean dry and intact.  Right groin is soft and flat and no pain on palpation.    A/P:    1. Postop check  Assessment & Plan:   Distribution of the pain has improved with conservative measures, specifically gabapentin.  Pain persists higher up in the more lateral right groin but no pain in the thigh anymore.  Pain is at about a 4 he reports the spikes up with activity.  No other new complaints or concerns.       We will order a CT pelvis to evaluate for any early postoperative complications that might be intervened on

## 2021-07-23 NOTE — Assessment & Plan Note (Signed)
Distribution of the pain has improved with conservative measures, specifically gabapentin.  Pain persists higher up in the more lateral right groin but no pain in the thigh anymore.  Pain is at about a 4 he reports the spikes up with activity.  No other new complaints or concerns.

## 2021-08-01 ENCOUNTER — Inpatient Hospital Stay: Admit: 2021-08-01 | Payer: TRICARE (CHAMPUS) | Attending: Surgery

## 2021-08-01 DIAGNOSIS — Z09 Encounter for follow-up examination after completed treatment for conditions other than malignant neoplasm: Secondary | ICD-10-CM

## 2021-08-01 MED ORDER — IOPAMIDOL 61 % IV SOLN
61 % | Freq: Once | INTRAVENOUS | Status: AC | PRN
Start: 2021-08-01 — End: 2021-08-01
  Administered 2021-08-01: 20:00:00 100 mL via INTRAVENOUS

## 2022-12-22 LAB — COMPREHENSIVE METABOLIC PANEL
ALT: 21 U/L (ref 0–50)
AST: 19 U/L (ref 0–50)
Albumin/Globulin Ratio: 1.9 (ref 1.00–2.70)
Albumin: 4.9 g/dL (ref 3.5–5.2)
Alk Phosphatase: 89 U/L (ref 40–130)
Anion Gap: 12 mmol/L (ref 2–17)
BUN: 13 mg/dL (ref 6–20)
CO2: 27 mmol/L (ref 22–29)
Calcium: 10 mg/dL (ref 8.5–10.7)
Chloride: 101 mmol/L (ref 98–107)
Creatinine: 1 mg/dL (ref 0.7–1.3)
Est, Glom Filt Rate: 106 mL/min/1.73mÂ² (ref >=60)
Globulin: 2.6 g/dL (ref 1.9–4.4)
Glucose: 88 mg/dL (ref 70–99)
Osmolaliy Calculated: 279 mosm/kg (ref 270–287)
Potassium: 4.2 mmol/L (ref 3.5–5.3)
Sodium: 140 mmol/L (ref 135–145)
Total Bilirubin: 0.46 mg/dL (ref 0.00–1.20)
Total Protein: 7.5 g/dL (ref 5.7–8.3)

## 2022-12-22 LAB — CBC WITH AUTO DIFFERENTIAL
Basophils %: 0.8 % (ref 0.0–2.0)
Basophils Absolute: 0.1 10*3/uL (ref 0.0–0.2)
Eosinophils %: 7.3 % — ABNORMAL HIGH (ref 0.0–7.0)
Eosinophils Absolute: 0.6 10*3/uL — ABNORMAL HIGH (ref 0.0–0.5)
Hematocrit: 46.3 % (ref 38.0–52.0)
Hemoglobin: 16.1 g/dL (ref 13.0–17.3)
Immature Grans (Abs): 0.02 10*3/uL (ref 0.00–0.06)
Immature Granulocytes %: 0.3 % (ref 0.0–0.6)
Lymphocytes Absolute: 2.6 10*3/uL (ref 1.0–3.2)
Lymphocytes: 32.9 % (ref 15.0–45.0)
MCH: 30.1 pg (ref 27.0–34.5)
MCHC: 34.8 g/dL (ref 30.0–36.0)
MCV: 86.7 fL (ref 84.0–100.0)
MPV: 10.7 fL (ref 7.0–12.2)
Monocytes %: 6.8 % (ref 4.0–12.0)
Monocytes Absolute: 0.5 10*3/uL (ref 0.3–1.0)
NRBC Absolute: 0 10*3/uL (ref 0.000–0.012)
NRBC Automated: 0 % (ref 0.0–0.2)
Neutrophils %: 51.9 % (ref 42.0–74.0)
Neutrophils Absolute: 4.2 10*3/uL (ref 1.6–7.3)
Platelets: 216 10*3/uL (ref 140–440)
RBC: 5.34 x10e6/mcL (ref 4.00–5.60)
RDW: 12.4 % (ref 10.0–17.0)
WBC: 8 10*3/uL (ref 3.8–10.6)

## 2022-12-22 LAB — LIPASE: Lipase: 21 U/L (ref 13–60)

## 2022-12-22 LAB — TSH: TSH, 3rd Generation: 2.79 u[IU]/mL (ref 0.358–3.740)

## 2022-12-24 LAB — CELIAC DISEASE PANEL
Endomysial IgA: NEGATIVE
Gliadin Antibodies IgA: 4 U (ref 0–19)
Gliadin Antibodies IgG: 2 U (ref 0–19)
IgA, Total: 166 mg/dL (ref 90–386)
Tissue Transglutaminase (tTG) Antibody (IgG): 3 U/mL (ref 0–5)
Tissue Transglutaminase IgA: <2 U/mL (ref 0–3)
